# Patient Record
Sex: Male | Born: 1990 | Race: White | Hispanic: No | Marital: Single | Smoking: Former smoker
Health system: Southern US, Community
[De-identification: ages and names within clinical notes are randomized; demographics above are authoritative.]

## PROBLEM LIST (undated history)

## (undated) DIAGNOSIS — F988 Other specified behavioral and emotional disorders with onset usually occurring in childhood and adolescence: Secondary | ICD-10-CM

## (undated) HISTORY — DX: Other specified behavioral and emotional disorders with onset usually occurring in childhood and adolescence: F98.8

---
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
## 2012-01-16 ENCOUNTER — Other Ambulatory Visit: Payer: Self-pay | Admitting: *Deleted

## 2012-01-16 MED ORDER — METHYLPHENIDATE HCL ER (OSM) 27 MG PO TBCR: 27.0000 mg | EXTENDED_RELEASE_TABLET | ORAL | Status: DC

## 2012-01-16 NOTE — Telephone Encounter (Signed)
Rx ready for pick up. 

## 2012-02-21 DIAGNOSIS — J0510 Acute epiglottitis without obstruction: Secondary | ICD-10-CM | POA: Insufficient documentation

## 2012-02-21 DIAGNOSIS — Z224 Carrier of infections with a predominantly sexual mode of transmission: Secondary | ICD-10-CM | POA: Insufficient documentation

## 2012-02-21 DIAGNOSIS — R599 Enlarged lymph nodes, unspecified: Secondary | ICD-10-CM | POA: Insufficient documentation

## 2012-02-21 DIAGNOSIS — J029 Acute pharyngitis, unspecified: Secondary | ICD-10-CM | POA: Insufficient documentation

## 2012-03-01 ENCOUNTER — Encounter: Payer: Self-pay | Admitting: Internal Medicine

## 2012-03-01 ENCOUNTER — Ambulatory Visit (INDEPENDENT_AMBULATORY_CARE_PROVIDER_SITE_OTHER): Payer: BC Managed Care – PPO | Admitting: Internal Medicine

## 2012-03-01 VITALS — BP 130/78 | HR 120 | Temp 98.0°F | Ht 72.0 in | Wt 186.5 lb

## 2012-03-01 DIAGNOSIS — J029 Acute pharyngitis, unspecified: Secondary | ICD-10-CM

## 2012-03-01 DIAGNOSIS — Z8709 Personal history of other diseases of the respiratory system: Secondary | ICD-10-CM

## 2012-03-01 DIAGNOSIS — Z23 Encounter for immunization: Secondary | ICD-10-CM

## 2012-03-01 DIAGNOSIS — J0510 Acute epiglottitis without obstruction: Secondary | ICD-10-CM

## 2012-03-01 DIAGNOSIS — R599 Enlarged lymph nodes, unspecified: Secondary | ICD-10-CM

## 2012-03-01 DIAGNOSIS — Z224 Carrier of infections with a predominantly sexual mode of transmission: Secondary | ICD-10-CM

## 2012-03-01 NOTE — Progress Notes (Signed)
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   MRN: [id number]  HPI Steven [name] is a 21 yo Male who is in the ID clinic with his mother. Referred by Dr. Pollyann Kennedy regarding recurrent epiglottitis/pharyngitis. The patient has no significant past medical history. He was referred to clinic due to recurrent epiglottis. He mentions that he first had an episode in July 2012, Nov 2012, then January 2013, April 2013 (dx with [facility] via throat cx; all other STI workup negative; txd with suprax and azithro) and Oct 2013. In October, he reports having fever of 101, chills, swollen submandibular nodes, pharyngitis, and intense pain swallowing liquids. Lasting 8-9 days. He wa sseent by Dr. Pollyann Kennedy who recx for [facility] which was negative, HIV testing was negative and patient started to improve, but also took left over clindamycin starting on 10/23. He states that he is better now. On his oct visit with Dr. Pollyann Kennedy, he undewent flex fiberoptic laryngoscopy which showed epiglottis severely thickened and erythematous. No difficulty with secreations or stridor at that time  His symptoms of "epiglottitis" usually include the onset of sore throat, swollen LN L> R, pharyngitis (voice changes), and intense pain "slicing" like sensation when swallowing liquids or food. Most intense with liquids. He states the pain is unbearable and does not often respond to ibuprofen.   He was initially seen by Dr. Pollyann Kennedy in [place] 2013, he had been placed on penicillin and prednisone taper by PCP but switched to clindamycin for 14 day to cover atypical. Negative strep culture and ebv titers consistent with past infection. 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  The patient is very concerned that he has recurrent epiglottitis and would like to know how it can be treated, what work up is entailed. 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  He has been healthy otherwise, no prior history of hospitalization.he does not report having recurrent episodes of pneumonia, sinusitis nor poorly healing skin lesions  Current Outpatient Prescriptions on File Prior to Visit  Medication Sig Dispense Refill  . clindamycin (CLEOCIN) 300 MG capsule       . methylphenidate (CONCERTA) 27 MG CR tablet Take 1 tablet (27 mg total) by mouth every morning.  30 tablet  0   Active Ambulatory Problems    Diagnosis Date Noted  . ADD 02/12/2007  . TOE PAIN 03/28/2010  . 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 Enlargement of lymph nodes 02/21/2012  . Acute epiglottitis without mention of obstruction 02/21/2012  . Acute pharyngitis 02/21/2012   Resolved Ambulatory Problems    Diagnosis Date Noted  . No Resolved Ambulatory Problems   Past Medical History  Diagnosis Date  . ADD (attention deficit disorder)    History  Substance Use Topics  . Smoking status: Never Smoker   . Smokeless tobacco: Never Used  . Alcohol Use: Not on file  family history includes Arthritis in his other; Cancer in his other; and Hypertension in his father and mother.    Review of Systems Review of Systems  Constitutional: Negative for fever, chills, diaphoresis, activity change, appetite change, fatigue and unexpected weight change.  HENT: Negative for congestion, sore throat, rhinorrhea, sneezing, trouble swallowing and sinus pressure.  Eyes: Negative for photophobia and visual disturbance.  Respiratory: Negative for cough, chest  tightness, shortness of breath, wheezing and stridor.  Cardiovascular: Negative for chest pain, palpitations and leg swelling.  Gastrointestinal: Negative for nausea, vomiting, abdominal pain, diarrhea, constipation, blood in stool, abdominal distention and anal bleeding.  Genitourinary: Negative for dysuria, hematuria, flank pain and difficulty urinating.    Musculoskeletal: Negative for myalgias, back pain, joint swelling, arthralgias and gait problem.  Skin: Negative for color change, pallor, rash and wound.  Neurological: Negative for dizziness, tremors, weakness and light-headedness.  Hematological: Negative for adenopathy. Does not bruise/bleed easily.  Psychiatric/Behavioral: Negative for behavioral problems, confusion, sleep disturbance, dysphoric mood, decreased concentration and agitation.       Objective:   Physical Exam  BP 130/78  Pulse 120  Temp 98 F (36.7 C) (Oral)  Ht 6' (1.829 m)  Wt 186 lb 8 oz (84.596 kg)  BMI 25.29 kg/m2 Physical Exam  Constitutional: He is oriented to person, place, and time. He appears well-developed and well-nourished. No distress.  HENT:  Mouth/Throat: Oropharynx is clear and moist. No oropharyngeal exudate. Left TM occluded with cerumen. R TM is clear. Prominent, mobile, nontender submandibular LN L>R Cardiovascular: tachycardia, regular rhythm and normal heart sounds. Exam reveals no gallop and no friction rub. No murmur heard.  Pulmonary/Chest: Effort normal and breath sounds normal. No respiratory distress. He has no wheezes.  Lymphadenopathy: +submandibular adenopathy.  Neurological: He is alert and oriented to person, place, and time.  Skin: Skin is warm and dry. No rash noted. No erythema. Tattoo to left arm Psychiatric: He has a normal mood and affect. His behavior is normal.   Labs: Oct 2013: HIV negative/ GC DNA probe negative     Assessment & Plan:  Recurrent epiglottitis & pharyngitis = we have discussed that there are several causes of epiglottitis/pharyngitis including bacterial and viral causes. With exception of gonorrhea diagnosis in April episode, it is presumed that these are most likely due to viral etiology.   We will check immunoglobulins to see if any immunodeficiency, although he does not report having recurrent episodes of pneumonia, sinusitis nor poorly healing skin  lesions  He is most distraught regarding what to do with the next episode and how to manage his symptoms, I have asked him to call us if he has a recurrent episode so that we can treat his pain and assess the need for throat culture vs. Other imaging such as neck CT looking for peritonsilar abscess

## 2012-03-11 ENCOUNTER — Other Ambulatory Visit: Payer: Self-pay

## 2012-03-11 MED ORDER — METHYLPHENIDATE HCL ER (OSM) 27 MG PO TBCR: 27.0000 mg | EXTENDED_RELEASE_TABLET | ORAL | Status: DC

## 2012-03-11 NOTE — Telephone Encounter (Signed)
Called and advise pt mom Concerta Rx ready for pick up.     MW

## 2012-03-11 NOTE — Telephone Encounter (Signed)
OV 03/01/12. Concerta last filled 01/16/12 #30 no refills plz advise   MW

## 2012-05-16 ENCOUNTER — Other Ambulatory Visit: Payer: Self-pay | Admitting: *Deleted

## 2012-05-16 ENCOUNTER — Encounter: Payer: Self-pay | Admitting: *Deleted

## 2012-05-16 MED ORDER — METHYLPHENIDATE HCL ER (OSM) 27 MG PO TBCR: 27.0000 mg | EXTENDED_RELEASE_TABLET | ORAL | Status: DC

## 2012-05-16 NOTE — Telephone Encounter (Signed)
Left message Rx ready for pick agreement attached.

## 2012-07-04 ENCOUNTER — Encounter: Payer: Self-pay | Admitting: Family Medicine

## 2012-12-02 ENCOUNTER — Ambulatory Visit (INDEPENDENT_AMBULATORY_CARE_PROVIDER_SITE_OTHER)
Admission: RE | Admit: 2012-12-02 | Discharge: 2012-12-02 | Disposition: A | Discharge status: Home / Self Care | Payer: BC Managed Care – PPO | Source: Ambulatory Visit | Attending: Internal Medicine | Admitting: Internal Medicine

## 2012-12-02 ENCOUNTER — Encounter: Payer: Self-pay | Admitting: Internal Medicine

## 2012-12-02 ENCOUNTER — Ambulatory Visit (INDEPENDENT_AMBULATORY_CARE_PROVIDER_SITE_OTHER): Payer: BC Managed Care – PPO | Admitting: Internal Medicine

## 2012-12-02 VITALS — BP 110/65 | HR 76 | Temp 98.3°F | Wt 194.0 lb

## 2012-12-02 DIAGNOSIS — R061 Stridor: Secondary | ICD-10-CM

## 2012-12-02 NOTE — Progress Notes (Signed)
  Subjective:    Patient ID: Steven [name], male    DOB: [birth year], 22 y.o.   MRN: [id number]  HPI Acute visit. For about a year has noted that he has " loud breathing ", Not really shortness or breath. He is able to work out every day and run 2 miles without problems. Thinks the loud breathing is worse during inspiration.  Past Medical History  Diagnosis Date  . ADD (attention deficit disorder)    No past surgical history on file.  History   Social History  . Marital Status: Single    Spouse Name: N/A    Number of Children: N/A  . Years of Education: N/A   Occupational History  . Not on file.   Social History Main Topics  . Smoking status: Current Some Day Smoker    Types: Cigars  . Smokeless tobacco: Never Used  . Alcohol Use: Yes     Comment: socially   . Drug Use: Yes     Comment: marihuana twice a week  . Sexually Active: Not on file   Other Topics Concern  . Not on file   Social History Narrative  . No narrative on file     Review of Systems No fever chills  No cough per se. No GERD symptoms. Occasional postnasal dripping.     Objective:   Physical Exam BP 110/65  Pulse 76  Temp(Src) 98.3 F (36.8 C) (Oral)  Wt 194 lb (87.998 kg)  BMI 26.31 kg/m2  SpO2 95%  General -- alert, well-developed, NAD  HEENT -- TMs normal, throat w/o redness, face symmetric and not tender to palpation, nose slt congested, no d/c. When he talks, inspiration is indeed slt loud without clearcut stridor  Lungs -- normal respiratory effort, no intercostal retractions, no accessory muscle use, and normal breath sounds. Specifically, no wheezing.  Heart-- normal rate, regular rhythm, no murmur, and no gallop.    Neurologic-- alert & oriented X3 and strength normal in all extremities. Psych-- Cognition and judgment appear intact. Alert and cooperative with normal attention span and concentration.  not anxious appearing and not depressed appearing.       Assessment &  Plan:

## 2012-12-02 NOTE — Assessment & Plan Note (Addendum)
Loud breathing mostly with inspiration, stridor? Plan: Chest x-ray and PFTs to asses his upper airway and rule out proximal obstruction.

## 2012-12-02 NOTE — Patient Instructions (Addendum)
Please get your x-ray at the other [facility]  office located at: [address] [place], across from [facility].  Please go to the basement, this is a walk-in facility, they are open from 8:30 to 5:30 PM. Phone number [phone]. --- Will schedule a special breathing test at the pulmonary office

## 2012-12-05 ENCOUNTER — Telehealth: Payer: Self-pay | Admitting: *Deleted

## 2012-12-05 ENCOUNTER — Ambulatory Visit (HOSPITAL_COMMUNITY)
Admission: RE | Admit: 2012-12-05 | Discharge: 2012-12-05 | Disposition: A | Discharge status: Home / Self Care | Payer: BC Managed Care – PPO | Source: Ambulatory Visit | Attending: Internal Medicine | Admitting: Internal Medicine

## 2012-12-05 DIAGNOSIS — R061 Stridor: Secondary | ICD-10-CM

## 2012-12-05 DIAGNOSIS — J988 Other specified respiratory disorders: Secondary | ICD-10-CM | POA: Insufficient documentation

## 2012-12-05 LAB — PULMONARY FUNCTION TEST

## 2012-12-05 MED ORDER — AZITHROMYCIN 250 MG PO TABS: ORAL_TABLET | ORAL | Status: DC

## 2012-12-05 MED ORDER — ALBUTEROL SULFATE (5 MG/ML) 0.5% IN NEBU
2.5000 mg | INHALATION_SOLUTION | Freq: Once | RESPIRATORY_TRACT | Status: AC
  Administered 2012-12-05: 2.5 mg via RESPIRATORY_TRACT

## 2012-12-05 NOTE — Telephone Encounter (Signed)
Spoke with patient, verbalized understanding. Clarified sig with Dr Drue Novel. Verbal orders for "2 tabs the first day. Than 1 tab a day the next 4 days". Orders enacted and verified with Dr. Drue Novel prior to processing.

## 2012-12-05 NOTE — Telephone Encounter (Signed)
Message copied by Shirlee More I on Thu Dec 05, 2012  1:23 PM ------      Message from: Steven [name]      Created: Wed Dec 04, 2012  4:50 PM       (Symptoms are not consistent with a pneumonia but there is a question of  Infiltrate).            Please call the patient, question of pneumonia on the x-rays:      Call in a Z-Pak:      Patient to call if  fever, cough, sputum production.       ------

## 2012-12-11 ENCOUNTER — Telehealth: Payer: Self-pay | Admitting: *Deleted

## 2012-12-11 DIAGNOSIS — F988 Other specified behavioral and emotional disorders with onset usually occurring in childhood and adolescence: Secondary | ICD-10-CM

## 2012-12-11 MED ORDER — METHYLPHENIDATE HCL ER (OSM) 27 MG PO TBCR: 27.0000 mg | EXTENDED_RELEASE_TABLET | ORAL | Status: DC

## 2012-12-11 NOTE — Telephone Encounter (Signed)
30

## 2012-12-11 NOTE — Telephone Encounter (Signed)
Rx for concerta printed and placed up front. Pt notified.

## 2012-12-11 NOTE — Telephone Encounter (Signed)
Patients mother called and is requesting refill on Concerta. Last OV 12/02/12, date of last refill 05/16/12 #30. Agreement and UDS on file, pt low risk. Okay to refill? Lowne pt.

## 2012-12-24 ENCOUNTER — Encounter: Payer: Self-pay | Admitting: Family Medicine

## 2012-12-25 ENCOUNTER — Other Ambulatory Visit: Payer: Self-pay | Admitting: Internal Medicine

## 2012-12-25 MED ORDER — ALBUTEROL SULFATE HFA 108 (90 BASE) MCG/ACT IN AERS: 2.0000 | INHALATION_SPRAY | Freq: Four times a day (QID) | RESPIRATORY_TRACT | Status: AC | PRN

## 2012-12-30 ENCOUNTER — Ambulatory Visit: Payer: BC Managed Care – PPO

## 2013-03-13 ENCOUNTER — Other Ambulatory Visit: Payer: Self-pay

## 2013-03-14 ENCOUNTER — Other Ambulatory Visit: Payer: Self-pay | Admitting: *Deleted

## 2013-03-14 DIAGNOSIS — F988 Other specified behavioral and emotional disorders with onset usually occurring in childhood and adolescence: Secondary | ICD-10-CM

## 2013-03-14 MED ORDER — METHYLPHENIDATE HCL ER (OSM) 27 MG PO TBCR: 27.0000 mg | EXTENDED_RELEASE_TABLET | ORAL | Status: DC

## 2013-03-14 NOTE — Telephone Encounter (Signed)
Last visit-12/02/2012  Last filled-12/11/2012  Please advise. SW

## 2013-05-02 ENCOUNTER — Other Ambulatory Visit: Payer: Self-pay | Admitting: *Deleted

## 2013-05-02 DIAGNOSIS — F988 Other specified behavioral and emotional disorders with onset usually occurring in childhood and adolescence: Secondary | ICD-10-CM

## 2013-05-02 MED ORDER — METHYLPHENIDATE HCL ER (OSM) 27 MG PO TBCR: 27.0000 mg | EXTENDED_RELEASE_TABLET | ORAL | Status: DC

## 2013-05-02 NOTE — Telephone Encounter (Signed)
Last seen-12/02/2012  Last fillled-03/14/2013  UDS-05/24/2012 low risk, contract signed   Please advise. SW

## 2013-06-02 ENCOUNTER — Telehealth: Payer: Self-pay | Admitting: *Deleted

## 2013-06-02 DIAGNOSIS — F988 Other specified behavioral and emotional disorders with onset usually occurring in childhood and adolescence: Secondary | ICD-10-CM

## 2013-06-02 MED ORDER — METHYLPHENIDATE HCL ER (OSM) 27 MG PO TBCR: 27.0000 mg | EXTENDED_RELEASE_TABLET | ORAL | Status: DC

## 2013-06-02 NOTE — Telephone Encounter (Signed)
Apt scheduled 06/09/13 at 3:45.    KP

## 2013-06-02 NOTE — Telephone Encounter (Signed)
Ok to refill 1 month but pt needs add f/u

## 2013-06-02 NOTE — Telephone Encounter (Signed)
Patient mother requesting refill for pts Concerta. Last filled 21/26/14 #30 Last OV 12/02/12 (acute w/ Paz) Last ADD f/u 08/28/11 (w/ Beverely Lowabori) Agreement on file UDS low risk on 05/2012, due for rescreen Okay to refill?

## 2013-06-09 ENCOUNTER — Ambulatory Visit: Payer: BC Managed Care – PPO | Admitting: Family Medicine

## 2013-06-09 DIAGNOSIS — Z0289 Encounter for other administrative examinations: Secondary | ICD-10-CM

## 2013-07-04 ENCOUNTER — Telehealth: Payer: Self-pay | Admitting: *Deleted

## 2013-07-04 NOTE — Telephone Encounter (Signed)
Appt made for Monday 07/07/13 at 3:45

## 2013-07-07 ENCOUNTER — Encounter: Payer: Self-pay | Admitting: Family Medicine

## 2013-07-07 ENCOUNTER — Ambulatory Visit (INDEPENDENT_AMBULATORY_CARE_PROVIDER_SITE_OTHER): Payer: BC Managed Care – PPO | Admitting: Family Medicine

## 2013-07-07 VITALS — BP 114/68 | HR 73 | Temp 98.6°F | Wt 175.6 lb

## 2013-07-07 DIAGNOSIS — F988 Other specified behavioral and emotional disorders with onset usually occurring in childhood and adolescence: Secondary | ICD-10-CM

## 2013-07-07 MED ORDER — METHYLPHENIDATE HCL ER (OSM) 36 MG PO TBCR: 36.0000 mg | EXTENDED_RELEASE_TABLET | Freq: Every day | ORAL | Status: DC

## 2013-07-07 NOTE — Progress Notes (Signed)
Patient ID: Steven MarketLucas M Pinch, male   DOB: [birth year], 23 y.o.   MRN: [id number]   Subjective:    Patient ID: Steven MarketLucas M Sweigert, male    DOB: [birth year], 23 y.o.   MRN: [id number] HPI Pt here for f/u of ADD.  Pt states the med is not lasting long enough.          Objective:    .BP 114/68  Pulse 73  Temp(Src) 98.6 F (37 C) (Oral)  Wt 175 lb 9.6 oz (79.652 kg)  SpO2 98% General appearance: alert, cooperative, appears stated age and no distress Neck: no adenopathy, no carotid bruit, no JVD, supple, symmetrical, trachea midline and thyroid not enlarged, symmetric, no tenderness/mass/nodules Lungs: clear to auscultation bilaterally Heart: S1, S2 normal        Assessment & Plan:  1. ADD (attention deficit disorder) Increase concerta dose - methylphenidate (CONCERTA) 36 MG CR tablet; Take 1 tablet (36 mg total) by mouth daily.  Dispense: 30 tablet; Refill: 0

## 2013-07-07 NOTE — Patient Instructions (Signed)
Attention Deficit Hyperactivity Disorder Attention deficit hyperactivity disorder (ADHD) is a problem with behavior issues based on the way the brain functions (neurobehavioral disorder). It is a common reason for behavior and academic problems in school. SYMPTOMS  There are 3 types of ADHD. The 3 types and some of the symptoms include:  Inattentive  Gets bored or distracted easily.  Loses or forgets things. Forgets to hand in homework.  Has trouble organizing or completing tasks.  Difficulty staying on task.  An inability to organize daily tasks and school work.  Leaving projects, chores, or homework unfinished.  Trouble paying attention or responding to details. Careless mistakes.  Difficulty following directions. Often seems like is not listening.  Dislikes activities that require sustained attention (like chores or homework).  Hyperactive-impulsive  Feels like it is impossible to sit still or stay in a seat. Fidgeting with hands and feet.  Trouble waiting turn.  Talking too much or out of turn. Interruptive.  Speaks or acts impulsively.  Aggressive, disruptive behavior.  Constantly busy or on the go, noisy.  Often leaves seat when they are expected to remain seated.  Often runs or climbs where it is not appropriate, or feels very restless.  Combined  Has symptoms of both of the above. Often children with ADHD feel discouraged about themselves and with school. They often perform well below their abilities in school. As children get older, the excess motor activities can calm down, but the problems with paying attention and staying organized persist. Most children do not outgrow ADHD but with good treatment can learn to cope with the symptoms. DIAGNOSIS  When ADHD is suspected, the diagnosis should be made by professionals trained in ADHD. This professional will collect information about the individual suspected of having ADHD. Information must be collected from  various settings where the person lives, works, or attends school.  Diagnosis will include:  Confirming symptoms began in childhood.  Ruling out other reasons for the child's behavior.  The health care providers will check with the child's school and check their medical records.  They will talk to teachers and parents.  Behavior rating scales for the child will be filled out by those dealing with the child on a daily basis. A diagnosis is made only after all information has been considered. TREATMENT  Treatment usually includes behavioral treatment, tutoring or extra support in school, and stimulant medicines. Because of the way a person's brain works with ADHD, these medicines decrease impulsivity and hyperactivity and increase attention. This is different than how they would work in a person who does not have ADHD. Other medicines used include antidepressants and certain blood pressure medicines. Most experts agree that treatment for ADHD should address all aspects of the person's functioning. Along with medicines, treatment should include structured classroom management at school. Parents should reward good behavior, provide constant discipline, and limit-setting. Tutoring should be available for the child as needed. ADHD is a life-long condition. If untreated, the disorder can have long-term serious effects into adolescence and adulthood. HOME CARE INSTRUCTIONS   Often with ADHD there is a lot of frustration among family members dealing with the condition. Blame and anger are also feelings that are common. In many cases, because the problem affects the family as a whole, the entire family may need help. A therapist can help the family find better ways to handle the disruptive behaviors of the person with ADHD and promote change. If the person with ADHD is young, most of the therapist's work   is with the parents. Parents will learn techniques for coping with and improving their child's  behavior. Sometimes only the child with the ADHD needs counseling. Your health care providers can help you make these decisions.  Children with ADHD may need help learning how to organize. Some helpful tips include:  Keep routines the same every day from wake-up time to bedtime. Schedule all activities, including homework and playtime. Keep the schedule in a place where the person with ADHD will often see it. Mark schedule changes as far in advance as possible.  Schedule outdoor and indoor recreation.  Have a place for everything and keep everything in its place. This includes clothing, backpacks, and school supplies.  Encourage writing down assignments and bringing home needed books. Work with your child's teachers for assistance in organizing school work.  Offer your child a well-balanced diet. Breakfast that includes a balance of whole grains, protein and, fruits or vegetables is especially important for school performance. Children should avoid drinks with caffeine including:  Soft drinks.  Coffee.  Tea.  However, some older children (adolescents) may find these drinks helpful in improving their attention. Because it can also be common for adolescents with ADHD to become addicted to caffeine, talk with your health care provider about what is a safe amount of caffeine intake for your child.  Children with ADHD need consistent rules that they can understand and follow. If rules are followed, give small rewards. Children with ADHD often receive, and expect, criticism. Look for good behavior and praise it. Set realistic goals. Give clear instructions. Look for activities that can foster success and self-esteem. Make time for pleasant activities with your child. Give lots of affection.  Parents are their children's greatest advocates. Learn as much as possible about ADHD. This helps you become a stronger and better advocate for your child. It also helps you educate your child's teachers and  instructors if they feel inadequate in these areas. Parent support groups are often helpful. A national group with local chapters is called Children and Adults with Attention Deficit Hyperactivity Disorder (CHADD). SEEK MEDICAL CARE IF:  Your child has repeated muscle twitches, cough or speech outbursts.  Your child has sleep problems.  Your child has a marked loss of appetite.  Your child develops depression.  Your child has new or worsening behavioral problems.  Your child develops dizziness.  Your child has a racing heart.  Your child has stomach pains.  Your child develops headaches. SEEK IMMEDIATE MEDICAL CARE IF:  Your child has been diagnosed with depression or anxiety and the symptoms seem to be getting worse.  Your child has been depressed and suddenly appears to have increased energy or motivation.  You are worried that your child is having a bad reaction to a medication he or she is taking for ADHD. Document Released: 04/14/2002 Document Revised: 02/12/2013 Document Reviewed: 12/30/2012 Exit[employer] Patient Information 2014 [employer].  

## 2013-07-07 NOTE — Progress Notes (Signed)
Pre visit review using our clinic review tool, if applicable. No additional management support is needed unless otherwise documented below in the visit note. 

## 2013-10-10 ENCOUNTER — Telehealth: Payer: Self-pay | Admitting: Family Medicine

## 2013-10-10 DIAGNOSIS — F988 Other specified behavioral and emotional disorders with onset usually occurring in childhood and adolescence: Secondary | ICD-10-CM

## 2013-10-10 MED ORDER — METHYLPHENIDATE HCL ER (OSM) 36 MG PO TBCR: 36.0000 mg | EXTENDED_RELEASE_TABLET | Freq: Every day | ORAL | Status: DC

## 2013-10-10 NOTE — Telephone Encounter (Signed)
Vm left advising Rx will be ready for pick up on Monday.     KP

## 2013-10-10 NOTE — Telephone Encounter (Signed)
Caller name:Tracie Garretson Relation to WK:[id number] Call back number: [phone] Pharmacy:  Reason for call: patient wife called and requested a refill for concerta. Please advise

## 2014-01-19 ENCOUNTER — Telehealth: Payer: Self-pay | Admitting: Family Medicine

## 2014-01-19 DIAGNOSIS — F988 Other specified behavioral and emotional disorders with onset usually occurring in childhood and adolescence: Secondary | ICD-10-CM

## 2014-01-19 MED ORDER — METHYLPHENIDATE HCL ER (OSM) 36 MG PO TBCR: 36.0000 mg | EXTENDED_RELEASE_TABLET | Freq: Every day | ORAL | Status: DC

## 2014-01-19 NOTE — Telephone Encounter (Signed)
Vm left advised Rx ready for pick up.     KP

## 2014-01-19 NOTE — Telephone Encounter (Signed)
Caller name:Guynes, Tracey Relation to AC:[id number] Call back [phone] Pharmacy:  Reason for call: pt is needing new rx methylchenidate (concerta) 36 mg, po, cr Please call when available for pick up.

## 2014-02-20 ENCOUNTER — Other Ambulatory Visit: Payer: Self-pay

## 2014-04-13 IMAGING — CR DG CHEST 2V
2 series · 2 of 2 positions shown · non-contrast
Comparison: None.

CLINICAL DATA: Shortness of breath, stridor

CHEST - 2 VIEW

[view not recorded (1 of 2)]
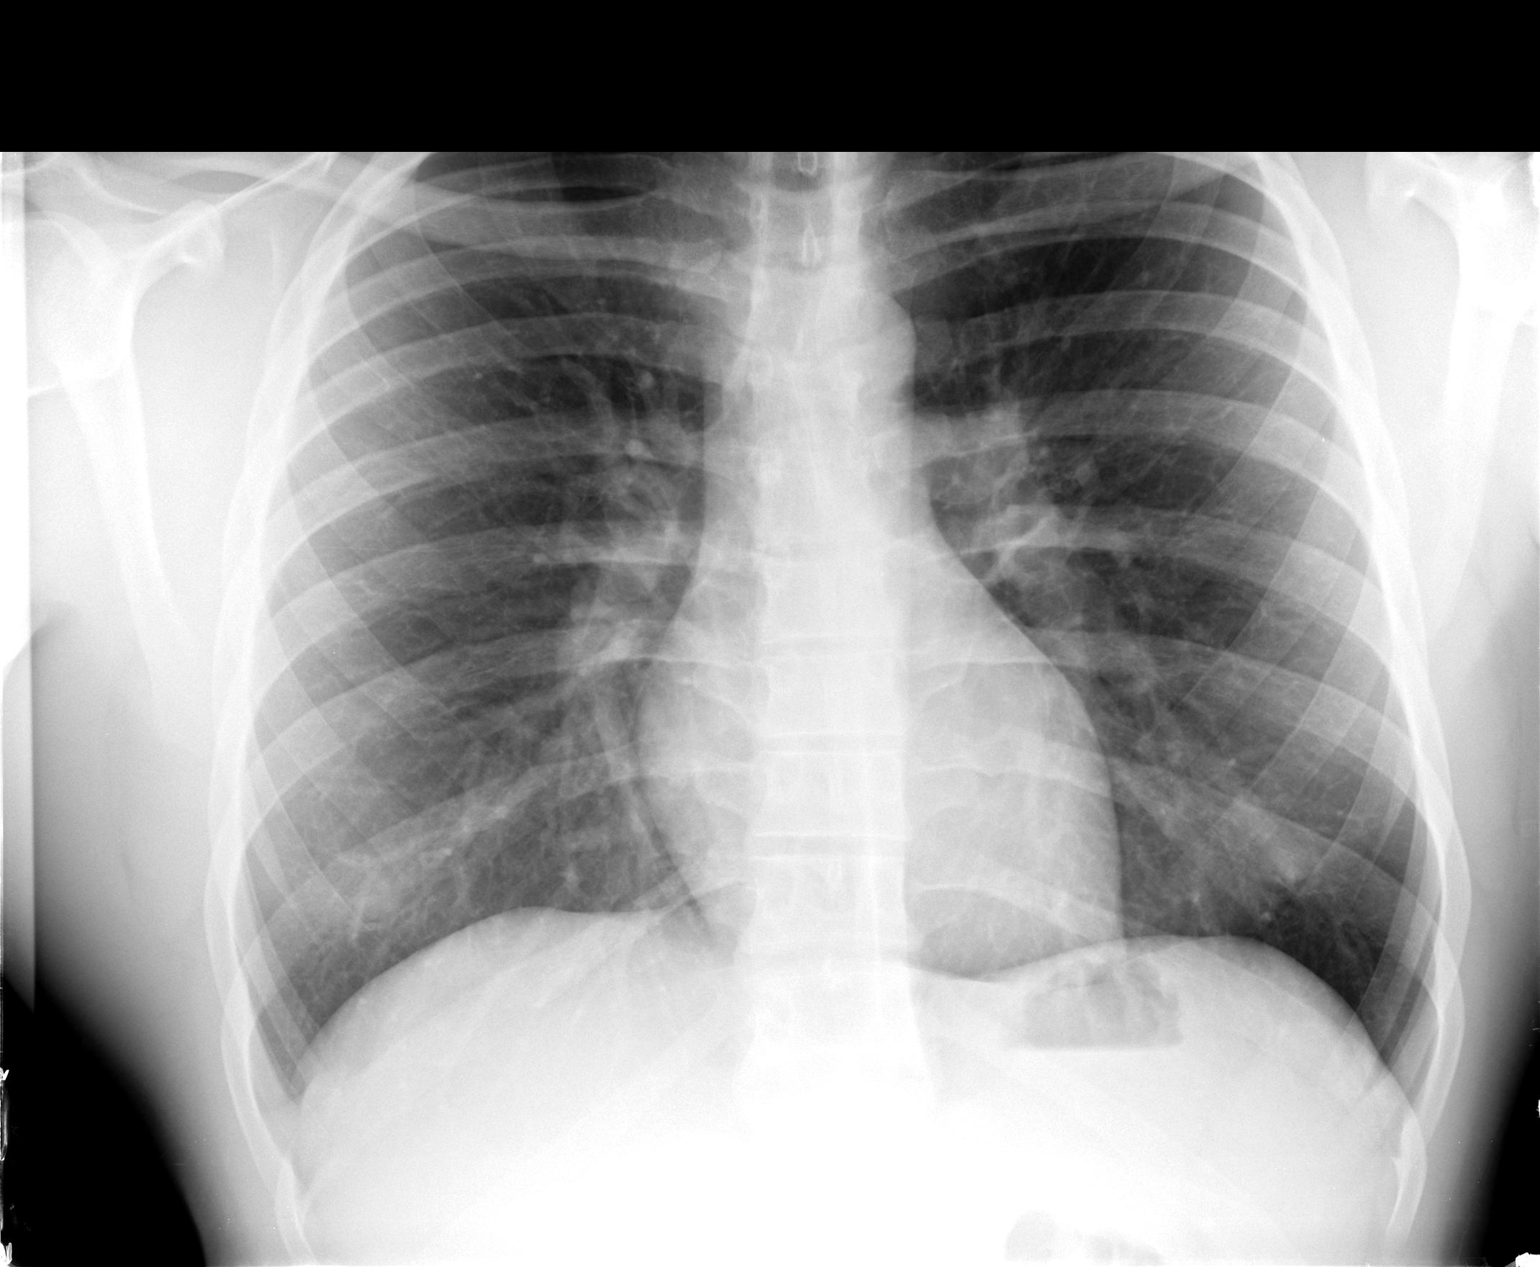

[view not recorded (2 of 2)]
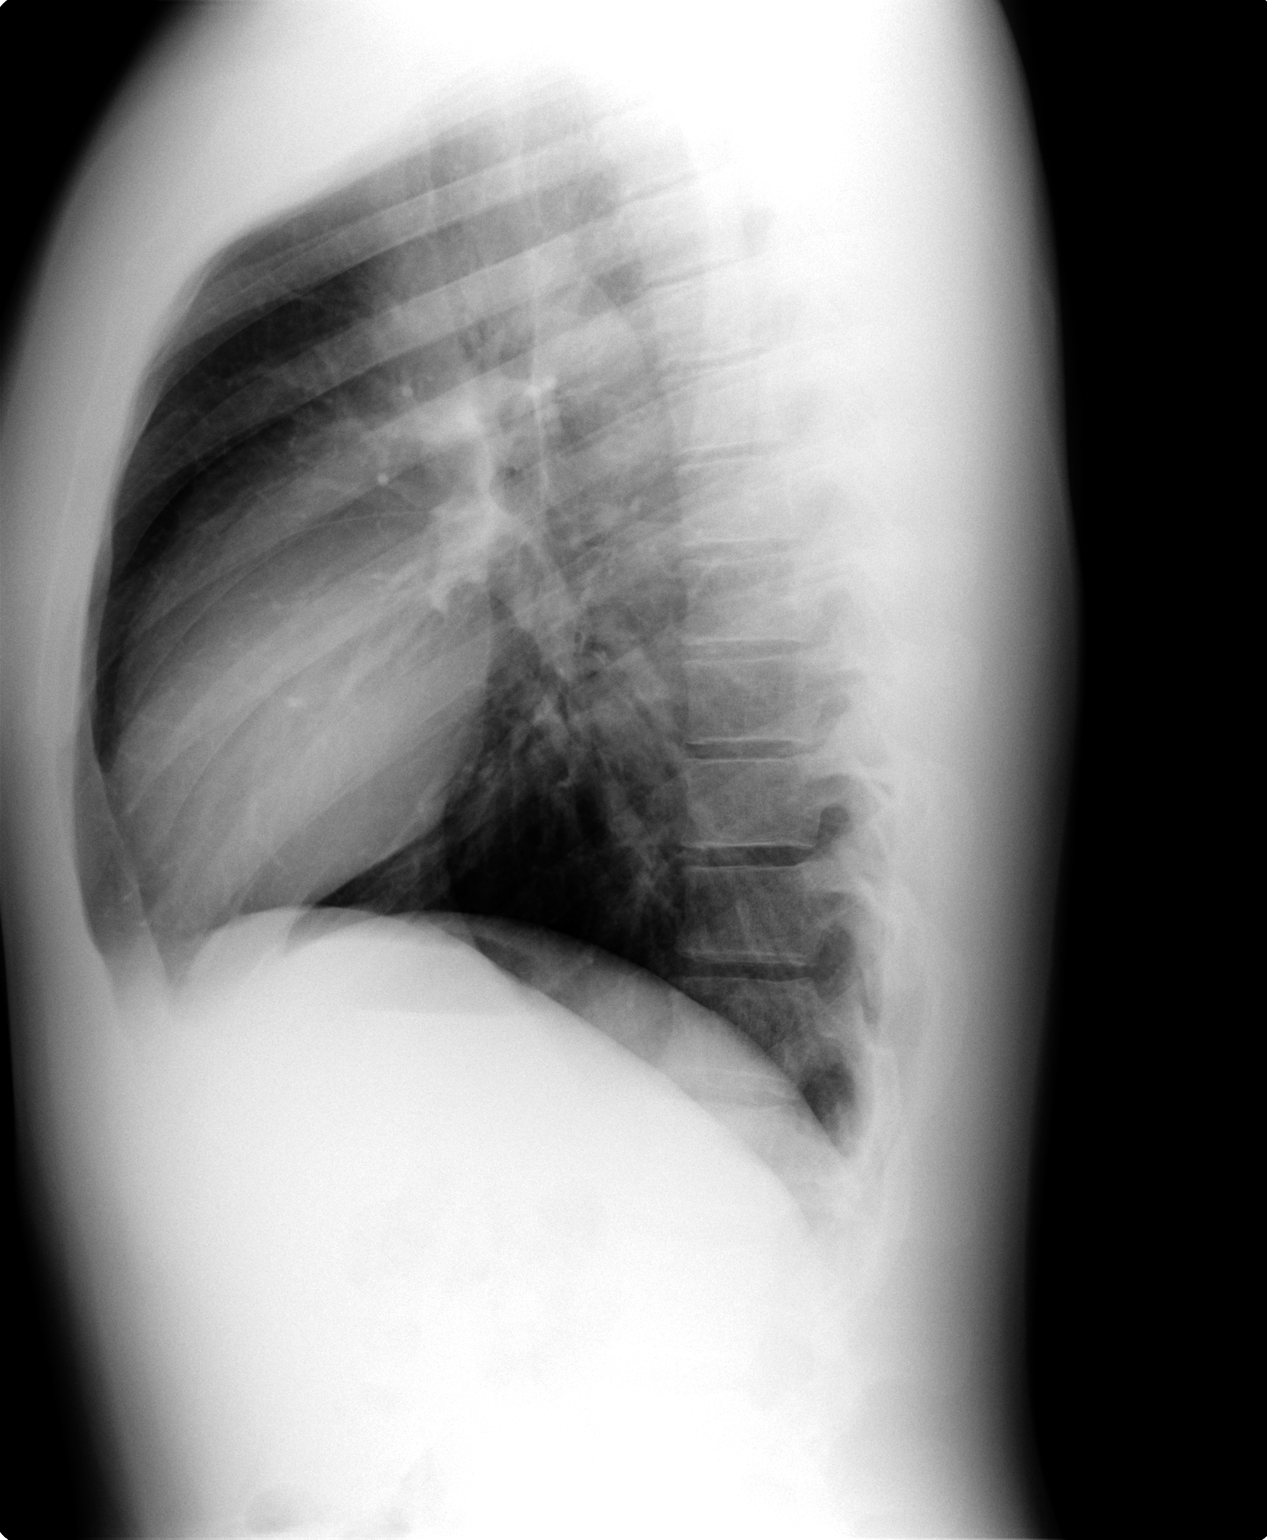

[2 of 2 positions shown; findings below may reference images not displayed]

FINDINGS: Very mild patchy left lower lobe opacity is possible. No
pleural effusion or pneumothorax.

The heart is normal in size.

Visualized osseous structures are within normal limits.
IMPRESSION: Very mild patchy left lower lobe opacity is possible, pneumonia not
excluded.

## 2014-12-04 ENCOUNTER — Encounter: Payer: Self-pay | Admitting: Medical

## 2014-12-04 ENCOUNTER — Ambulatory Visit (INDEPENDENT_AMBULATORY_CARE_PROVIDER_SITE_OTHER): Payer: BLUE CROSS/BLUE SHIELD | Admitting: Medical

## 2014-12-04 VITALS — BP 130/80 | HR 85 | Temp 98.1°F | Ht 72.0 in | Wt 175.2 lb

## 2014-12-04 DIAGNOSIS — F909 Attention-deficit hyperactivity disorder, unspecified type: Secondary | ICD-10-CM | POA: Diagnosis not present

## 2014-12-04 DIAGNOSIS — F988 Other specified behavioral and emotional disorders with onset usually occurring in childhood and adolescence: Secondary | ICD-10-CM

## 2014-12-04 MED ORDER — AMPHETAMINE-DEXTROAMPHET ER 20 MG PO CP24: 20.0000 mg | ORAL_CAPSULE | Freq: Every day | ORAL | Status: DC

## 2014-12-04 NOTE — Progress Notes (Signed)
Pre visit review using our clinic review tool, if applicable. No additional management support is needed unless otherwise documented below in the visit note. 

## 2014-12-04 NOTE — Patient Instructions (Signed)
Rx of adderal xr 20 mg. Advised avoiding caffeinated beverage while taking. Follow up in one month with pcp or with myself.   Control substance contract signed.(Discussed how contracts work)

## 2014-12-04 NOTE — Progress Notes (Signed)
   Subjective:    Patient ID: Steven [name], male    DOB: [birth year], 24 y.o.   MRN: [id number]  HPI   Pt in stating he has had taking various medications for. He tried adderal, concerta an vyvanse(but none recently over a year). Pt thought he did better with Adderal. Pt states on medication for ADD since 24 year old. Pt was on concerta last year or so. Then he stopped. He always has had problems, focusing and staying on task. 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 No history of htn. No hx of tachycardia. Occasional red bull 1-2 times a week.    Review of Systems  Constitutional: Negative for fever, chills and fatigue.  Respiratory: Negative for cough, chest tightness, shortness of breath and wheezing.   Skin: Negative for rash.  Neurological: Negative for dizziness, syncope, weakness and headaches.  Hematological: Negative for adenopathy. Does not bruise/bleed easily.  Psychiatric/Behavioral: Positive for decreased concentration. Negative for suicidal ideas, behavioral problems, sleep disturbance and agitation. The patient is not nervous/anxious.     Past Medical History  Diagnosis Date  . ADD (attention deficit disorder)     History   Social History  . Marital Status: Single    Spouse Name: N/A  . Number of Children: N/A  . Years of Education: N/A   Occupational History  . Not on file.   Social History Main Topics  . Smoking status: Former Smoker    Types: Cigars  . Smokeless tobacco: Never Used  . Alcohol Use: Yes     Comment: socially   . Drug Use: Yes     Comment: marihuana twice a week  . Sexual Activity: Not on file   Other Topics Concern  . Not on file   Social History Narrative    No past surgical history on file.  Family History  Problem Relation Age of Onset  . Hypertension Mother   . Hypertension Father   . Arthritis Other   . Cancer Other     No Known Allergies  Current Outpatient Prescriptions on File Prior to Visit  Medication Sig Dispense  Refill  . albuterol (VENTOLIN HFA) 108 (90 BASE) MCG/ACT inhaler Inhale 2 puffs into the lungs 4 (four) times daily as needed for wheezing (or persistent cough). 1 Inhaler 1  . methylphenidate (CONCERTA) 36 MG PO CR tablet Take 1 tablet (36 mg total) by mouth daily. (Patient not taking: Reported on 12/04/2014) 30 tablet 0   No current facility-administered medications on file prior to visit.    BP 130/80 mmHg  Pulse 85  Temp(Src) 98.1 F (36.7 C) (Oral)  Ht 6' (1.829 m)  Wt 175 lb 3.2 oz (79.47 kg)  BMI 23.76 kg/m2  SpO2 98%       Objective:   Physical Exam  General Mental Status- Alert. General Appearance- Not in acute distress.   Skin General: Color- Normal Color. Moisture- Normal Moisture.   Chest and Lung Exam Auscultation: Breath Sounds:-Normal. CTA.  Cardiovascular Auscultation:Rythm- Regular, Rate and Rythm Murmurs & Other Heart Sounds:Auscultation of the heart reveals- No Murmurs.   Neurologic Cranial Nerve exam:- CN III-XII intact(No nystagmus), symmetric smile. Strength:- 5/5 equal and symmetric strength both upper and lower extremities.      Assessment & Plan:   Rx of adderal xr 20 mg. Advised avoiding caffeinated beverage while taking. Follow up in one month with pcp or with myself.   Control substance contract signed.(Discussed how contracts work)

## 2015-01-15 ENCOUNTER — Telehealth: Payer: Self-pay | Admitting: Family Medicine

## 2015-01-15 MED ORDER — AMPHETAMINE-DEXTROAMPHET ER 20 MG PO CP24: 20.0000 mg | ORAL_CAPSULE | Freq: Every day | ORAL | Status: DC

## 2015-01-15 NOTE — Telephone Encounter (Signed)
I tried calling the patient but the mailbox was full. Unable to leave a message the Rx was placed at check in.      KP

## 2015-01-15 NOTE — Telephone Encounter (Signed)
Ok to refill x 1  

## 2015-01-15 NOTE — Telephone Encounter (Signed)
Pt needs refill on adderall. He is out. Please call when rx is ready. He came in for med refills appt recently with Ramon Dredge. Pt was advised to call 3 days ahead in the future.

## 2015-01-15 NOTE — Telephone Encounter (Signed)
You have not see the patient since 07/07/13 but he saw Ramon Dredge who re-started him on Adderall. Please advise if the refill is appropriate.      KP

## 2015-01-18 NOTE — Telephone Encounter (Signed)
Patient informed. 

## 2015-02-18 ENCOUNTER — Telehealth: Payer: Self-pay | Admitting: Family Medicine

## 2015-02-18 MED ORDER — AMPHETAMINE-DEXTROAMPHET ER 20 MG PO CP24: 20.0000 mg | ORAL_CAPSULE | Freq: Every day | ORAL | Status: DC

## 2015-02-18 NOTE — Telephone Encounter (Signed)
VM left advising the Rx is ready for pick up.      KP 

## 2015-02-18 NOTE — Telephone Encounter (Signed)
Patient seen Steven [name] 12/04/14 and filled 01/15/15 #30 UDS 01/15/15 high risk    Please advise     KP

## 2015-02-18 NOTE — Telephone Encounter (Signed)
Ok to refill for 3 months if that is what he wants

## 2015-02-18 NOTE — Telephone Encounter (Signed)
Pt needing refill on adderall. He is out. Advised to call 3 days ahead. Please call when ready for pick up.

## 2015-02-22 ENCOUNTER — Telehealth: Payer: Self-pay | Admitting: Family Medicine

## 2015-02-22 NOTE — Telephone Encounter (Signed)
Opened in error

## 2015-05-27 ENCOUNTER — Telehealth: Payer: Self-pay | Admitting: Family Medicine

## 2015-05-27 MED ORDER — AMPHETAMINE-DEXTROAMPHET ER 20 MG PO CP24: 20.0000 mg | ORAL_CAPSULE | Freq: Every day | ORAL | Status: DC

## 2015-05-27 NOTE — Telephone Encounter (Signed)
Apt scheduled for 06/15/15. He is aware that he can pick up tomorrow.      KP

## 2015-05-27 NOTE — Telephone Encounter (Signed)
Caller name: Derrick   Relationship to patient: Self  Can be reached: [phone]  Reason for call: Pt request a refill on his adderall Rx. He would like to have it today if possible .   Pt would like to be notified when ready for pick up

## 2015-06-15 ENCOUNTER — Ambulatory Visit: Payer: BLUE CROSS/BLUE SHIELD | Admitting: Family Medicine

## 2015-06-16 ENCOUNTER — Telehealth: Payer: Self-pay | Admitting: Family Medicine

## 2015-06-16 NOTE — Telephone Encounter (Signed)
Pt was no show 06/15/15 2:45pm for follow up appt, pt has not rescheduled, 1st no show w/in 12 months, charge or no charge?

## 2015-06-17 NOTE — Telephone Encounter (Signed)
No charge but pt needs phone call to see why he no showed

## 2015-06-17 NOTE — Telephone Encounter (Signed)
Called pt. He states he doesn't think he should take ADD meds anymore. He forgot that he had an appt. Pt declined rescheduling at this time.

## 2015-06-17 NOTE — Telephone Encounter (Signed)
charge 

## 2015-06-18 ENCOUNTER — Encounter: Payer: Self-pay | Admitting: Family Medicine

## 2015-06-18 NOTE — Telephone Encounter (Signed)
Marked to charge and mailing no show letter °

## 2015-08-05 ENCOUNTER — Encounter: Payer: Self-pay | Admitting: Internal Medicine

## 2015-08-09 ENCOUNTER — Telehealth: Payer: Self-pay | Admitting: Family Medicine

## 2015-08-09 NOTE — Telephone Encounter (Signed)
Pt is requesting a refill on his Adderall Rx.    CB: [phone]

## 2015-08-09 NOTE — Telephone Encounter (Signed)
Last OV was 12/04/14 with Esperanza RichtersEdward Saguier. Last refill was 05/1915. Pt was advised to follow up 06/15/15 and he was no show.  Last UDS 01/15/15 with high risk.  Please advise on refill.

## 2015-08-09 NOTE — Telephone Encounter (Signed)
Spoke with pt and advised him of the note below. Pt voices understanding.

## 2015-08-09 NOTE — Telephone Encounter (Signed)
uds was neg for adderall and + for xanax---  We don't give him xanax  no refill

## 2015-08-16 ENCOUNTER — Telehealth: Payer: Self-pay | Admitting: Family Medicine

## 2015-08-17 NOTE — Telephone Encounter (Signed)
error:[id number] ° °

## 2015-08-19 ENCOUNTER — Encounter: Payer: Self-pay | Admitting: Family Medicine

## 2015-08-19 ENCOUNTER — Ambulatory Visit (INDEPENDENT_AMBULATORY_CARE_PROVIDER_SITE_OTHER): Payer: BLUE CROSS/BLUE SHIELD | Admitting: Family Medicine

## 2015-08-19 VITALS — BP 117/72 | HR 84 | Temp 98.8°F | Wt 168.8 lb

## 2015-08-19 DIAGNOSIS — F909 Attention-deficit hyperactivity disorder, unspecified type: Secondary | ICD-10-CM | POA: Diagnosis not present

## 2015-08-19 DIAGNOSIS — F988 Other specified behavioral and emotional disorders with onset usually occurring in childhood and adolescence: Secondary | ICD-10-CM

## 2015-08-19 MED ORDER — AMPHETAMINE-DEXTROAMPHET ER 20 MG PO CP24: 20.0000 mg | ORAL_CAPSULE | Freq: Every day | ORAL | Status: DC

## 2015-08-19 NOTE — Patient Instructions (Signed)
Attention Deficit Hyperactivity Disorder  Attention deficit hyperactivity disorder (ADHD) is a problem with behavior issues based on the way the brain functions (neurobehavioral disorder). It is a common reason for behavior and academic problems in school.  SYMPTOMS   There are 3 types of ADHD. The 3 types and some of the symptoms include:  · Inattentive.    Gets bored or distracted easily.    Loses or forgets things. Forgets to hand in homework.    Has trouble organizing or completing tasks.    Difficulty staying on task.    An inability to organize daily tasks and school work.    Leaving projects, chores, or homework unfinished.    Trouble paying attention or responding to details. Careless mistakes.    Difficulty following directions. Often seems like is not listening.    Dislikes activities that require sustained attention (like chores or homework).  · Hyperactive-impulsive.    Feels like it is impossible to sit still or stay in a seat. Fidgeting with hands and feet.    Trouble waiting turn.    Talking too much or out of turn. Interruptive.    Speaks or acts impulsively.    Aggressive, disruptive behavior.    Constantly busy or on the go; noisy.    Often leaves seat when they are expected to remain seated.    Often runs or climbs where it is not appropriate, or feels very restless.  · Combined.    Has symptoms of both of the above.  Often children with ADHD feel discouraged about themselves and with school. They often perform well below their abilities in school.  As children get older, the excess motor activities can calm down, but the problems with paying attention and staying organized persist. Most children do not outgrow ADHD but with good treatment can learn to cope with the symptoms.  DIAGNOSIS   When ADHD is suspected, the diagnosis should be made by professionals trained in ADHD. This professional will collect information about the individual suspected of having ADHD. Information must be collected from  various settings where the person lives, works, or attends school.    Diagnosis will include:  · Confirming symptoms began in childhood.  · Ruling out other reasons for the child's behavior.  · The health care providers will check with the child's school and check their medical records.  · They will talk to teachers and parents.  · Behavior rating scales for the child will be filled out by those dealing with the child on a daily basis.  A diagnosis is made only after all information has been considered.  TREATMENT   Treatment usually includes behavioral treatment, tutoring or extra support in school, and stimulant medicines. Because of the way a person's brain works with ADHD, these medicines decrease impulsivity and hyperactivity and increase attention. This is different than how they would work in a person who does not have ADHD. Other medicines used include antidepressants and certain blood pressure medicines.  Most experts agree that treatment for ADHD should address all aspects of the person's functioning. Along with medicines, treatment should include structured classroom management at school. Parents should reward good behavior, provide constant discipline, and set limits. Tutoring should be available for the child as needed.  ADHD is a lifelong condition. If untreated, the disorder can have long-term serious effects into adolescence and adulthood.  HOME CARE INSTRUCTIONS   · Often with ADHD there is a lot of frustration among family members dealing with the condition. Blame   and anger are also feelings that are common. In many cases, because the problem affects the family as a whole, the entire family may need help. A therapist can help the family find better ways to handle the disruptive behaviors of the person with ADHD and promote change. If the person with ADHD is young, most of the therapist's work is with the parents. Parents will learn techniques for coping with and improving their child's behavior.  Sometimes only the child with the ADHD needs counseling. Your health care providers can help you make these decisions.  · Children with ADHD may need help learning how to organize. Some helpful tips include:  ¨ Keep routines the same every day from wake-up time to bedtime. Schedule all activities, including homework and playtime. Keep the schedule in a place where the person with ADHD will often see it. Mark schedule changes as far in advance as possible.  ¨ Schedule outdoor and indoor recreation.  ¨ Have a place for everything and keep everything in its place. This includes clothing, backpacks, and school supplies.  ¨ Encourage writing down assignments and bringing home needed books. Work with your child's teachers for assistance in organizing school work.  · Offer your child a well-balanced diet. Breakfast that includes a balance of whole grains, protein, and fruits or vegetables is especially important for school performance. Children should avoid drinks with caffeine including:  ¨ Soft drinks.  ¨ Coffee.  ¨ Tea.  ¨ However, some older children (adolescents) may find these drinks helpful in improving their attention. Because it can also be common for adolescents with ADHD to become addicted to caffeine, talk with your health care provider about what is a safe amount of caffeine intake for your child.  · Children with ADHD need consistent rules that they can understand and follow. If rules are followed, give small rewards. Children with ADHD often receive, and expect, criticism. Look for good behavior and praise it. Set realistic goals. Give clear instructions. Look for activities that can foster success and self-esteem. Make time for pleasant activities with your child. Give lots of affection.  · Parents are their children's greatest advocates. Learn as much as possible about ADHD. This helps you become a stronger and better advocate for your child. It also helps you educate your child's teachers and instructors  if they feel inadequate in these areas. Parent support groups are often helpful. A national group with local chapters is called Children and Adults with Attention Deficit Hyperactivity Disorder (CHADD).  SEEK MEDICAL CARE IF:  · Your child has repeated muscle twitches, cough, or speech outbursts.  · Your child has sleep problems.  · Your child has a marked loss of appetite.  · Your child develops depression.  · Your child has new or worsening behavioral problems.  · Your child develops dizziness.  · Your child has a racing heart.  · Your child has stomach pains.  · Your child develops headaches.  SEEK IMMEDIATE MEDICAL CARE IF:  · Your child has been diagnosed with depression or anxiety and the symptoms seem to be getting worse.  · Your child has been depressed and suddenly appears to have increased energy or motivation.  · You are worried that your child is having a bad reaction to a medication he or she is taking for ADHD.     This information is not intended to replace advice given to you by your health care provider. Make sure you discuss any questions you have with your   health care provider.     Document Released: 04/14/2002 Document Revised: 04/29/2013 Document Reviewed: 12/30/2012  Elsevier Interactive Patient Education ©2016 [employer]

## 2015-08-19 NOTE — Progress Notes (Signed)
Pre visit review using our clinic review tool, if applicable. No additional management support is needed unless otherwise documented below in the visit note. 

## 2015-08-19 NOTE — Progress Notes (Signed)
Patient ID: Steven [name], male    DOB: 1991/05/06  Age: 25 y.o. MRN: [id number]    Subjective:  Subjective HPI Steven [name] presents for f/u ADD f/u.  He states he does take the adderall but only the days he works.  So he does not take it everyday.  He said a friend helped him   Review of Systems  Constitutional: Negative for diaphoresis, appetite change, fatigue and unexpected weight change.  Eyes: Negative for pain, redness and visual disturbance.  Respiratory: Negative for cough, chest tightness, shortness of breath and wheezing.   Cardiovascular: Negative for chest pain, palpitations and leg swelling.  Endocrine: Negative for cold intolerance, heat intolerance, polydipsia, polyphagia and polyuria.  Genitourinary: Negative for dysuria, frequency and difficulty urinating.  Neurological: Negative for dizziness, light-headedness, numbness and headaches.    History Past Medical History  Diagnosis Date  . ADD (attention deficit disorder)     He has no past surgical history on file.   His family history includes Arthritis in his other; Cancer in his other; Hypertension in his father and mother.He reports that he has quit smoking. His smoking use included Cigars. He has never used smokeless tobacco. He reports that he drinks alcohol. He reports that he uses illicit drugs.  Current Outpatient Prescriptions on File Prior to Visit  Medication Sig Dispense Refill  . albuterol (VENTOLIN HFA) 108 (90 BASE) MCG/ACT inhaler Inhale 2 puffs into the lungs 4 (four) times daily as needed for wheezing (or persistent cough). 1 Inhaler 1   No current facility-administered medications on file prior to visit.     Objective:  Objective Physical Exam  Constitutional: He is oriented to person, place, and time. Vital signs are normal. He appears well-developed and well-nourished. He is sleeping.  HENT:  Head: Normocephalic and atraumatic.  Mouth/Throat: Oropharynx is clear and moist.  Eyes:  EOM are normal. Pupils are equal, round, and reactive to light.  Neck: Normal range of motion. Neck supple. No thyromegaly present.  Cardiovascular: Normal rate and regular rhythm.   No murmur [name]. Pulmonary/Chest: Effort normal and breath sounds normal. No respiratory distress. He has no wheezes. He has no rales. He exhibits no tenderness.  Musculoskeletal: He exhibits no edema or tenderness.  Neurological: He is alert and oriented to person, place, and time.  Skin: Skin is warm and dry.  Psychiatric: He has a normal mood and affect. His behavior is normal. Judgment and thought content normal.  Nursing note and vitals reviewed.  BP 117/72 mmHg  Pulse 84  Temp(Src) 98.8 F (37.1 C) (Oral)  Wt 168 lb 12.8 oz (76.567 kg)  SpO2 100% Wt Readings from Last 3 Encounters:  08/19/15 168 lb 12.8 oz (76.567 kg)  12/04/14 175 lb 3.2 oz (79.47 kg)  07/07/13 175 lb 9.6 oz (79.652 kg)     Lab Results  Component Value Date   WBC 8.9 05/17/2011   HGB 14.5 05/17/2011   HCT 42.2 05/17/2011   PLT 280.0 05/17/2011    No results found.   Assessment & Plan:  Plan I am having Steven [name] maintain his albuterol and amphetamine-dextroamphetamine.  Meds ordered this encounter  Medications  . amphetamine-dextroamphetamine (ADDERALL XR) 20 MG 24 hr capsule    Sig: Take 1 capsule (20 mg total) by mouth daily.    Dispense:  30 capsule    Refill:  0    Problem List Items Addressed This Visit    None    Visit Diagnoses  ADD (attention deficit disorder)    -  Primary    Relevant Medications    amphetamine-dextroamphetamine (ADDERALL XR) 20 MG 24 hr capsule       Follow-up: Return in about 6 months (around 02/18/2016), or if symptoms worsen or fail to improve, for add.  Donato Schultz, DO

## 2015-08-19 NOTE — Assessment & Plan Note (Signed)
Restart adderalll Discussed with pt--- it is illegal to take someone elses controlled substance Pt understands and agrees not to do it again

## 2015-09-16 ENCOUNTER — Telehealth: Payer: Self-pay | Admitting: Family Medicine

## 2015-09-16 DIAGNOSIS — F988 Other specified behavioral and emotional disorders with onset usually occurring in childhood and adolescence: Secondary | ICD-10-CM

## 2015-09-16 MED ORDER — AMPHETAMINE-DEXTROAMPHET ER 20 MG PO CP24: 20.0000 mg | ORAL_CAPSULE | Freq: Every day | ORAL | Status: DC

## 2015-09-16 NOTE — Telephone Encounter (Signed)
Placed refill on adderal #30 with 0 rf

## 2015-09-16 NOTE — Telephone Encounter (Signed)
Caller name: Franky MachoLuke Relationship to patient: self Can be reached: [phone]   Reason for call: Pt needing refill on Adderall. He ran out yesterday. Please call when RX ready to pick up.

## 2015-09-16 NOTE — Telephone Encounter (Signed)
Refill x1 

## 2015-09-16 NOTE — Telephone Encounter (Signed)
Last OV: 08/19/15  Last filled: 08/19/15, #30, 0 RF Sig: Take 1 capsule (20 mg total) by mouth daily UDS: 01/15/15, negative for Adderall, positive for benzos, high risk

## 2015-10-18 ENCOUNTER — Telehealth: Payer: Self-pay | Admitting: Family Medicine

## 2015-10-18 DIAGNOSIS — F988 Other specified behavioral and emotional disorders with onset usually occurring in childhood and adolescence: Secondary | ICD-10-CM

## 2015-10-18 MED ORDER — AMPHETAMINE-DEXTROAMPHET ER 20 MG PO CP24: 20.0000 mg | ORAL_CAPSULE | Freq: Every day | ORAL | Status: DC

## 2015-10-18 NOTE — Telephone Encounter (Signed)
Refill request for ADDERALL XR. Pt says that he is completely out of his medicine. Informed pt of refill policy. Pt expressed understanding.   [phone]

## 2015-10-18 NOTE — Telephone Encounter (Signed)
Patient aware Rx ready for pick up.      KP 

## 2015-11-04 ENCOUNTER — Encounter: Payer: Self-pay | Admitting: Family Medicine

## 2015-11-22 ENCOUNTER — Telehealth: Payer: Self-pay | Admitting: Family Medicine

## 2015-11-22 DIAGNOSIS — F988 Other specified behavioral and emotional disorders with onset usually occurring in childhood and adolescence: Secondary | ICD-10-CM

## 2015-11-22 NOTE — Telephone Encounter (Signed)
°  Relationship to patient: Self Can be reached: [phone]    Reason for call: Request refill amphetamine-dextroamphetamine (ADDERALL XR) 20 MG 24 hr capsule [[id number]][[id number]]

## 2015-11-22 NOTE — Telephone Encounter (Signed)
Last seen 08/19/15 and filled 10/18/15  UDS 08/19/15 Neg Adderall UDS 01/15/15 Neg Adderall  Moderate risk   Please advise     KP

## 2015-11-23 MED ORDER — AMPHETAMINE-DEXTROAMPHET ER 20 MG PO CP24: 20.0000 mg | ORAL_CAPSULE | Freq: Every day | ORAL | Status: DC

## 2015-11-23 NOTE — Telephone Encounter (Signed)
Rx printed and given to Dr. Laury AxonLowne for review and signature.

## 2015-11-23 NOTE — Telephone Encounter (Signed)
Refill x1--- pt states he does not take it everyday

## 2015-12-20 ENCOUNTER — Telehealth: Payer: Self-pay | Admitting: Family Medicine

## 2015-12-20 DIAGNOSIS — F988 Other specified behavioral and emotional disorders with onset usually occurring in childhood and adolescence: Secondary | ICD-10-CM

## 2015-12-20 MED ORDER — AMPHETAMINE-DEXTROAMPHET ER 20 MG PO CP24: 20.0000 mg | ORAL_CAPSULE | Freq: Every day | ORAL | 0 refills | Status: DC

## 2015-12-20 NOTE — Telephone Encounter (Signed)
patient aware med's will be ready for pick up after 10am tomorrow.    KP

## 2015-12-20 NOTE — Telephone Encounter (Signed)
Relationship to patient: self Can be reached: [phone]  Reason for call: pt called for refill on adderall. He is out. Advised to call 3-5 days ahead in future. He asked if possible to get today.

## 2016-01-24 ENCOUNTER — Telehealth: Payer: Self-pay | Admitting: Family Medicine

## 2016-01-24 DIAGNOSIS — F988 Other specified behavioral and emotional disorders with onset usually occurring in childhood and adolescence: Secondary | ICD-10-CM

## 2016-01-24 MED ORDER — AMPHETAMINE-DEXTROAMPHET ER 20 MG PO CP24: 20.0000 mg | ORAL_CAPSULE | Freq: Every day | ORAL | 0 refills | Status: AC

## 2016-01-24 NOTE — Telephone Encounter (Signed)
Caller name: Relationship to patient: Self  Can be reached: [phone]    Reason for call: Request refill on amphetamine-dextroamphetamine (ADDERALL XR) 20 MG 24 hr capsule [[id number]

## 2016-01-24 NOTE — Telephone Encounter (Signed)
Adderrall last filled 12/20/15 #30 UDS done 08/19/15, 01/15/15 Neg for Adderall list as Moderate Risk, he is due for a UDS  Please advise   KP

## 2016-01-24 NOTE — Telephone Encounter (Signed)
Pt came in and provided UDS per Selena BattenKim. Pt was handed RX but I failed to have him sign RX p/u log.

## 2016-01-24 NOTE — Telephone Encounter (Signed)
Patient aware Rx ready for pick up.      KP 

## 2016-01-24 NOTE — Telephone Encounter (Signed)
Refill x1--- need uds 

## 2016-02-17 ENCOUNTER — Telehealth: Payer: Self-pay | Admitting: Family Medicine

## 2016-02-17 NOTE — Telephone Encounter (Signed)
Patient is requesting a refill of amphetamine-dextroamphetamine (ADDERALL XR) 20 MG 24 hr capsule  Patient relation:self Patient phone: [phone]

## 2016-02-18 ENCOUNTER — Ambulatory Visit: Payer: BLUE CROSS/BLUE SHIELD | Admitting: Family Medicine

## 2016-02-18 NOTE — Telephone Encounter (Signed)
UDS on 01/24/16 was Negative.he is listed High risk and Dr.Lowne advised she would not prescribed anymore Adderall to the patient. Spoke with patient and I made him aware we will no longer prescribe, he said he does not understand why he has to do these drug screens anyway, this doesn't make any sense. I advised he signed a contract and he was in agreement once he received his prescriptions, [facility] I asked Why, he said he wanted her to know he dropped a few pills, I made the patient aware all of his recent drug screens  were negative since on the Adderall, he said he would find another doctor, I advised the rules will apply everywhere he goes. He said ok. I advised I will make Dr.Lowne aware.    KP

## 2016-02-18 NOTE — Telephone Encounter (Signed)
noted 

## 2016-02-18 NOTE — Telephone Encounter (Signed)
See uds

## 2016-02-18 NOTE — Telephone Encounter (Signed)
Patient is requesting Adderall Last seen 08/19/15 and filled 01/24/16#30 Previous 12/20/15 #30  All of his UDS are Negative for Adderall including the 01/24/16 UDS   Please advise    KP

## 2016-02-29 ENCOUNTER — Encounter: Payer: Self-pay | Admitting: Family Medicine

## 2017-08-13 ENCOUNTER — Other Ambulatory Visit: Payer: Self-pay

## 2017-08-13 ENCOUNTER — Emergency Department (HOSPITAL_COMMUNITY)
Admission: EM | Admit: 2017-08-13 | Discharge: 2017-08-13 | Disposition: A | Discharge status: Home / Self Care | Payer: BLUE CROSS/BLUE SHIELD | Attending: Emergency Medicine | Admitting: Emergency Medicine

## 2017-08-13 ENCOUNTER — Encounter (HOSPITAL_COMMUNITY): Payer: Self-pay | Admitting: Emergency Medicine

## 2017-08-13 DIAGNOSIS — Z87891 Personal history of nicotine dependence: Secondary | ICD-10-CM | POA: Diagnosis not present

## 2017-08-13 DIAGNOSIS — T50901A Poisoning by unspecified drugs, medicaments and biological substances, accidental (unintentional), initial encounter: Secondary | ICD-10-CM | POA: Insufficient documentation

## 2017-08-13 DIAGNOSIS — R55 Syncope and collapse: Secondary | ICD-10-CM | POA: Diagnosis present

## 2017-08-13 DIAGNOSIS — Z79899 Other long term (current) drug therapy: Secondary | ICD-10-CM | POA: Diagnosis not present

## 2017-08-13 DIAGNOSIS — F419 Anxiety disorder, unspecified: Secondary | ICD-10-CM | POA: Diagnosis not present

## 2017-08-13 NOTE — ED Notes (Signed)
Pt Steven [name] alert and oriented x 4 and is anxious. Pt is restless sitting in bed and has increased thirst. Pt keeps asking if he can take a nap. Pt encouraged to rest and given 3 cups of ice water at this time.

## 2017-08-13 NOTE — ED Triage Notes (Signed)
Pt was face down on the grass. RR were 4 in field and cyanotic. Patient was bagged and the given 1mg  Narcan and then another 1mg  after 15 mins. Patient responded well and is now alert and oriented x4 with VS WDL.

## 2017-08-13 NOTE — ED Notes (Signed)
Bed: [facility] Expected date:  Expected time:  Means of arrival:  Comments: 

## 2017-08-13 NOTE — ED Notes (Signed)
Pt Ex girlfriend is at bedside and pt gave verbally permission to discuss his condition to her,

## 2017-08-13 NOTE — ED Provider Notes (Signed)
[facility] Provider Note   CSN: [id number] Arrival date & time: 08/13/17  1535     History   Chief Complaint Chief Complaint  Patient presents with  . Drug Overdose    HPI Steven [name] is a 27 y.o. male.  HPI Patient brought in by EMS after likely opiate overdose.  Patient was found in the grass.  Patient was cyanotic and had low respiratory rate.  Patient was bag by EMS and given Narcan.  Patient woke up.  States that he was taking what he thought was cocaine but it was from a new source for him.  States he has not done opiates in the past.  Will occasionally drink some alcohol.  Denies suicidal thoughts.  No pain now.  States he does feel "weird".  He snorted the drug.  States he does not really remember much of what happened. Past Medical History:  Diagnosis Date  . ADD (attention deficit disorder)     Patient Active Problem List   Diagnosis Date Noted  . 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 Enlargement of lymph nodes 02/21/2012  . Acute epiglottitis without mention of obstruction 02/21/2012  . Acute pharyngitis 02/21/2012  . TOE PAIN 03/28/2010  . Attention deficit disorder 02/12/2007    History reviewed. No pertinent surgical history.      Home Medications    Prior to Admission medications   Medication Sig Start Date End Date Taking? Authorizing Provider  amphetamine-dextroamphetamine (ADDERALL XR) 30 MG 24 hr capsule Take 30 mg by mouth daily.   Yes [provider]  Naproxen Sodium (ALEVE) 220 MG CAPS Take 440 mg by mouth daily as needed (pain).   Yes [provider]  albuterol (VENTOLIN HFA) 108 (90 BASE) MCG/ACT inhaler Inhale 2 puffs into the lungs 4 (four) times daily as needed for wheezing (or persistent cough). Patient not taking: Reported on 08/13/2017 12/25/12   Wanda Plump, MD  amphetamine-dextroamphetamine (ADDERALL XR) 20 MG 24 hr capsule Take 1  capsule (20 mg total) by mouth daily. Patient not taking: Reported on 08/13/2017 01/24/16   Donato Schultz, DO    Family History Family History  Problem Relation Age of Onset  . Hypertension Mother   . Hypertension Father   . Arthritis Other   . Cancer Other     Social History Social History   Tobacco Use  . Smoking status: Former Smoker    Types: Cigars  . Smokeless tobacco: Never Used  Substance Use Topics  . Alcohol use: Yes    Comment: socially   . Drug use: Yes    Comment: marihuana twice a week     Allergies   Patient has no known allergies.   Review of Systems Review of Systems  Constitutional: Negative for appetite change.  HENT: Negative for congestion.   Respiratory: Negative for shortness of breath.   Cardiovascular: Negative for chest pain.  Gastrointestinal: Negative for abdominal distention.  Genitourinary: Negative for frequency.  Musculoskeletal: Negative for back pain.  Skin: Negative for pallor.  Neurological: Positive for syncope.  Hematological: Negative for adenopathy.  Psychiatric/Behavioral: Positive for confusion.     Physical Exam Updated Vital Signs BP (!) 136/118 (BP Location: Right Arm)   Pulse 99   Temp 98 F (36.7 C)   Resp (!) 22   Ht 5\' 11"  (1.803 m)   Wt 86.2 kg (190 lb)   SpO2 97%  BMI 26.50 kg/m   Physical Exam  Constitutional: He appears well-developed.  HENT:  Head: Atraumatic.  Eyes: Pupils are equal, round, and reactive to light. EOM are normal.  Neck: Neck supple.  Cardiovascular: Normal rate.  Pulmonary/Chest: Effort normal.  Abdominal: There is no tenderness.  Musculoskeletal: He exhibits no edema.  Neurological: He is alert.  Skin: Skin is warm.  Psychiatric: He has a normal mood and affect.     ED Treatments / Results  Labs (all labs ordered are listed, but only abnormal results are displayed) Labs Reviewed - No data to display  EKG None  Radiology No results  found.  Procedures Procedures (including critical care time)  Medications Ordered in ED Medications - No data to display   Initial Impression / Assessment and Plan / ED Course  I have reviewed the triage vital signs and the nursing notes.  Pertinent labs & imaging results that were available during my care of the patient were reviewed by me and considered in my medical decision making (see chart for details).     Patient with unintentional overdose.  Thought he was snorting cocaine but likely had an opiate on it.  Became unresponsive but resolved with Narcan.  Has been monitored while in the ER.  Did have a single episode of vomiting but maintained good mental status.  Denies suicide attempt.  Given resources and will be discharged.  Final Clinical Impressions(s) / ED Diagnoses   Final diagnoses:  Accidental drug overdose, initial encounter    ED Discharge Orders    None       Benjiman CorePickering, Cody Albus, MD 08/13/17 [id number]

## 2017-08-16 ENCOUNTER — Inpatient Hospital Stay: Payer: BLUE CROSS/BLUE SHIELD | Admitting: Family Medicine

## 2017-08-16 DIAGNOSIS — Z0289 Encounter for other administrative examinations: Secondary | ICD-10-CM

## 2017-08-21 ENCOUNTER — Encounter: Payer: Self-pay | Admitting: Family Medicine

## 2019-04-17 ENCOUNTER — Other Ambulatory Visit: Payer: Self-pay

## 2019-04-17 DIAGNOSIS — Z20822 Contact with and (suspected) exposure to covid-19: Secondary | ICD-10-CM

## 2019-04-19 LAB — NOVEL CORONAVIRUS, NAA: SARS-CoV-2, NAA: NOT DETECTED

## 2023-11-23 ENCOUNTER — Encounter: Payer: Self-pay | Admitting: Advanced Practice Midwife
